# Patient Record
Sex: Male | Born: 1970 | Race: White | Hispanic: No | Smoking: Never smoker
Health system: Southern US, Community
[De-identification: ages and names within clinical notes are randomized; demographics above are authoritative.]

## PROBLEM LIST (undated history)

## (undated) HISTORY — PX: ANKLE SURGERY: SHX546

---

## 2000-02-06 ENCOUNTER — Emergency Department (HOSPITAL_COMMUNITY): Admission: EM | Admit: 2000-02-06 | Discharge: 2000-02-06 | Payer: Self-pay | Admitting: Emergency Medicine

## 2000-02-06 ENCOUNTER — Encounter: Payer: Self-pay | Admitting: Emergency Medicine

## 2000-02-12 ENCOUNTER — Inpatient Hospital Stay (HOSPITAL_COMMUNITY): Admission: RE | Admit: 2000-02-12 | Discharge: 2000-02-14 | Payer: Self-pay | Admitting: Orthopedic Surgery

## 2000-02-12 ENCOUNTER — Encounter: Payer: Self-pay | Admitting: Orthopedic Surgery

## 2002-12-19 ENCOUNTER — Emergency Department (HOSPITAL_COMMUNITY): Admission: EM | Admit: 2002-12-19 | Discharge: 2002-12-19 | Payer: Self-pay | Admitting: Emergency Medicine

## 2007-07-11 ENCOUNTER — Emergency Department (HOSPITAL_COMMUNITY): Admission: EM | Admit: 2007-07-11 | Discharge: 2007-07-11 | Payer: Self-pay | Admitting: Emergency Medicine
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
## 2011-01-30 ENCOUNTER — Inpatient Hospital Stay (HOSPITAL_COMMUNITY)
Admission: AD | Admit: 2011-01-30 | Discharge: 2011-02-02 | DRG: 881 | Disposition: A | Discharge status: Home / Self Care | Payer: Medicaid Other | Source: Ambulatory Visit | Attending: Psychiatry | Admitting: Psychiatry

## 2011-01-30 ENCOUNTER — Emergency Department (HOSPITAL_COMMUNITY)
Admission: EM | Admit: 2011-01-30 | Discharge: 2011-01-30 | Disposition: A | Discharge status: Other Acute Inpatient Hospital | Payer: Medicaid Other | Source: Home / Self Care | Attending: Emergency Medicine | Admitting: Emergency Medicine

## 2011-01-30 DIAGNOSIS — F3289 Other specified depressive episodes: Principal | ICD-10-CM

## 2011-01-30 DIAGNOSIS — R197 Diarrhea, unspecified: Secondary | ICD-10-CM | POA: Insufficient documentation

## 2011-01-30 DIAGNOSIS — F329 Major depressive disorder, single episode, unspecified: Principal | ICD-10-CM

## 2011-01-30 DIAGNOSIS — Z560 Unemployment, unspecified: Secondary | ICD-10-CM

## 2011-01-30 DIAGNOSIS — R45851 Suicidal ideations: Secondary | ICD-10-CM

## 2011-01-30 DIAGNOSIS — F1910 Other psychoactive substance abuse, uncomplicated: Secondary | ICD-10-CM

## 2011-01-30 DIAGNOSIS — F411 Generalized anxiety disorder: Secondary | ICD-10-CM | POA: Insufficient documentation

## 2011-01-30 DIAGNOSIS — F1994 Other psychoactive substance use, unspecified with psychoactive substance-induced mood disorder: Secondary | ICD-10-CM

## 2011-01-30 DIAGNOSIS — R1110 Vomiting, unspecified: Secondary | ICD-10-CM | POA: Insufficient documentation

## 2011-01-30 DIAGNOSIS — R112 Nausea with vomiting, unspecified: Secondary | ICD-10-CM | POA: Insufficient documentation

## 2011-01-30 DIAGNOSIS — R109 Unspecified abdominal pain: Secondary | ICD-10-CM | POA: Insufficient documentation

## 2011-01-30 LAB — COMPREHENSIVE METABOLIC PANEL
AST: 19 U/L (ref 0–37)
Albumin: 4.6 g/dL (ref 3.5–5.2)
Alkaline Phosphatase: 81 U/L (ref 39–117)
BUN: 7 mg/dL (ref 6–23)
CO2: 26 mEq/L (ref 19–32)
Chloride: 106 mEq/L (ref 96–112)
GFR calc Af Amer: >60 mL/min (ref >60)
GFR calc non Af Amer: >60 mL/min (ref >60)
Potassium: 4.3 mEq/L (ref 3.5–5.1)
Total Bilirubin: 0.5 mg/dL (ref 0.3–1.2)

## 2011-01-30 LAB — ETHANOL: Alcohol, Ethyl (B): 98 mg/dL — ABNORMAL HIGH (ref 0–10)

## 2011-01-30 LAB — DIFFERENTIAL
Basophils Relative: 1 % (ref 0–1)
Lymphocytes Relative: 24 % (ref 12–46)
Lymphs Abs: 1.8 10*3/uL (ref 0.7–4.0)
Monocytes Absolute: 0.6 10*3/uL (ref 0.1–1.0)
Monocytes Relative: 8 % (ref 3–12)
Neutro Abs: 5 10*3/uL (ref 1.7–7.7)
Neutrophils Relative %: 66 % (ref 43–77)

## 2011-01-30 LAB — CBC
HCT: 44.9 % (ref 39.0–52.0)
Hemoglobin: 15.4 g/dL (ref 13.0–17.0)
MCH: 32.8 pg (ref 26.0–34.0)
RBC: 4.69 MIL/uL (ref 4.22–5.81)

## 2011-01-30 LAB — RAPID URINE DRUG SCREEN, HOSP PERFORMED: Tetrahydrocannabinol: POSITIVE — AB

## 2011-01-31 DIAGNOSIS — F1910 Other psychoactive substance abuse, uncomplicated: Secondary | ICD-10-CM

## 2011-02-12 NOTE — Discharge Summary (Signed)
Howard Velazquez, Howard Velazquez     ACCOUNT NO.:  [id number]  MEDICAL RECORD NO.:  [id number]           PATIENT TYPE:  I  LOCATION:  0303                          FACILITY:  [facility]  PHYSICIAN:  Eulogio Ditch, MD DATE OF BIRTH:  02-17-1971  DATE OF ADMISSION:  01/30/2011 DATE OF DISCHARGE:  02/02/2011                              DISCHARGE SUMMARY   IDENTIFYING INFORMATION:  This is a 40 year old male.  This was a voluntary admission.  HISTORY OF PRESENT ILLNESS:  First [facility] admission for Howard Velazquez who presents with a history of depression and suicidal thoughts with a plan to shoot himself and reporting that he had access to a gun at home.  Had been using alcohol, drinking up to 12 beers daily, along with use of opiates and crack cocaine.  Prior to admission he had recently gone through 35,000 dollars worth of cash and assets to support his drug habit.  Parents very angry with him.  Also reporting some auditory hallucinations, noncommand in type, over the previous few days.  No history of previous admissions at the [facility].  Currently unemployed with a 57 year old child at home to support and single.  MEDICAL EVALUATION:  He was medically evaluated in the [facility] Emergency Room where he presented with Vital Signs: Temperature 98.2, pulse 89, respirations 18, blood pressure 123/87 and denying any chronic medical conditions.  A normally developed male in no acute distress and reported that he was coming off opiates and previous use was 2 days ago, using OxyContin.  Had also regularly been using marijuana, crack, alcohol, Klonopin, and other opiates.  Alcohol level 98.  Chemistry normal.  BUN 7, creatinine 1.21.  Normal liver enzymes.  Urine drug screen positive for cocaine, benzodiazepines, and cannabis metabolites.  Normal CBC, hemoglobin 15.4.  He received 0.1 mg of clonidine in the emergency room for withdrawal symptoms and metoclopramide 10 mg for nausea.  ADMITTING  MENTAL STATUS EXAMINATION:  A fully alert male, cooperative, casually dressed, fair eye contact, attentive, very tearful, feeling depressed and uncertain after going through significant amounts of cash. Thinking coherent.  Memory intact.  Cooperative.  Cognitive function intact.  Endorsing auditory hallucinations but did not appear to be responding or internally distracted.  AXIS I:  Mood disorder, not otherwise specified, rule out substance- induced mood disorder, polysubstance abuse, rule out dependence. AXIS II:  Deferred. AXIS III:  No diagnosis. AXIS IV:  Unemployed, financial issues. AXIS V:  Current 30.  COURSE OF HOSPITALIZATION:  He was admitted to our dual diagnosis unit and placed on clonidine and Librium withdrawal protocols.  He had been previously prescribed Lexapro 20 mg for depression by his primary provider and we elected to continue that.  He was gradually assimilated into the milieu, and participation in group activities was appropriate. Group therapy participation was adequate.  He reported having a lot of difficulty finding work due to [employer] and had broken up with his girlfriend 2 weeks prior to admission.  Previously divorced for 10 years.  He has a 50 year old son who he was helping to support.  Having a contentious relationship with parents who are angry about his substance abuse.  He gave permission  for a counselor to speak with his family, and he planned to live with either his girlfriend Howard Velazquez or another family member other than his parents at the time of discharge. He planned on returning to [facility] in [place] for his usual Wednesday appointment.  His girlfriend reported having no concerns with the patient's safety, that there were no weapons, drugs, or alcohol in the home.  Howard Velazquez's detox was uneventful.  Did have some skeletal muscle cramping alleviated with our routine detox medications.  By February 4th, he was requesting  discharge, denying any dangerous thoughts, and his girlfriend was willing to have him come home and felt that this was a safe plan.  DISCHARGE MENTAL STATUS EXAMINATION:  A Fully alert male.  No further withdrawal symptoms.  In full contact with reality with no dangerous ideas.  DISCHARGE DIAGNOSES: 1. Depressive disorder, not otherwise specified, rule out substance-     induced mood disorder. 2. Polysubstance abuse.  DISCHARGE MEDICATIONS:  Lexapro 20 mg daily.  DISCHARGE CONDITION:  Stable.  DISCHARGE PLAN:  Follow up at [facility] on Wednesday, February 8th, at 8:00 p.m.     Howard Velazquez, N.P.   ______________________________ Eulogio Ditch, MD    MAS/MEDQ  D:  02/04/2011  T:  02/04/2011  Job:  [id number]  Electronically Signed by Howard Velazquez N.P. on 02/05/2011 10:11:28 AM Electronically Signed by Eulogio Ditch  on 02/06/2011 09:18:24 AM

## 2011-02-17 NOTE — H&P (Signed)
NAME:  Howard Velazquez, Howard Velazquez     ACCOUNT NO.:  [id number]  MEDICAL RECORD NO.:  [id number]           PATIENT TYPE:  I  LOCATION:  0303                          FACILITY:  [facility]  PHYSICIAN:  Eulogio Ditch, MD DATE OF BIRTH:  11-Dec-1971  DATE OF ADMISSION:  01/30/2011 DATE OF DISCHARGE:                      PSYCHIATRIC ADMISSION ASSESSMENT   This is a 40 year old male voluntarily admitted on 01/30/2011.  HISTORY OF PRESENT ILLNESS:  The patient presents with a history of depression and suicidal ideation with a plan to shoot himself, reporting that he has a gun at home.  Has been using alcohol, drinking up to 12 beers a day, opiates and crack.  He states he also went through $35,000 worth of cash and assets.  States his parents are very angry with him. He denies any legal charges.  Reporting some auditory hallucinations, noncommand type, over the past few days.  PAST PSYCHIATRIC HISTORY:  First admission to [facility]. No current outpatient mental health therapy.  SOCIAL HISTORY:  The patient lives in [place].  He is single.  He has a 68 year old child.  He is currently unemployed.  FAMILY HISTORY:  None.  ALCOHOL AND DRUG HISTORY:  The patient has been drinking up to 12 beers daily.  Denies any seizures or blackouts.  No legal charges.  Again, has been using opiates and crack cocaine.  Denies any IV drug use.  PRIMARY CARE PROVIDER:  None.  MEDICAL PROBLEMS:  The patient considers himself healthy.  No acute or chronic health issues.  MEDICATIONS:  Has been on Lexapro 20 mg prescribed by his primary care provider.  He does not see any benefit from the medicine yet.  Denies any other medications.  DRUG ALLERGIES:  No known allergies.  PHYSICAL EXAMINATION:  Physical exam was done at the [facility] emergency department.  This is a normally-developed male, appears in no distress.  He is complaining of some withdrawal symptoms.  His CBC was within  normal limits.  His sodium 146.  His urine drug screen was positive for cocaine, positive for cannabis.  His alcohol level was 98. He did receive Reglan, clonidine and Ativan during the emergency room stay.  MENTAL STATUS EXAMINATION:  The patient is alert and cooperative, casually dressed.  Fair eye contact.  He is very tearful.  The patient is feeling very depressed and uncertain about the development of what he has done.  Thought processes are coherent.  Did endorse auditory hallucinations, does not appear to be actively responding.  Cognitive function intact.  His memory appears intact.  He is agreeable to recommendations and followup.  AXIS I:  Polysubstance abuse, rule out dependence.  Substance use.  Mood disorder. AXIS II:  Deferred. AXIS III:  No known medical conditions. AXIS IV:  Problems with occupation and primary support.  Other psychosocial problems.  Financial issues. AXIS V:  Current is 30.  Our plan is to put the patient on a clonidine and a Librium protocol. We will continue with his Lexapro.  We will have Risperdal available b.i.d. for psychotic symptoms.  Encourage the patient to attend groups. We will continue to assess motivation for rehab, consider a family session with his support group.  Tentative length of stay is 3 to 5 days.     Landry Corporal, N.P.   ______________________________ Eulogio Ditch, MD    JO/MEDQ  D:  01/31/2011  T:  01/31/2011  Job:  [id number]  Electronically Signed by Limmie PatriciaP. on 02/11/2011 02:58:12 PM Electronically Signed by Eulogio Ditch  on 02/17/2011 06:06:23 AM
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
## 2011-05-17 NOTE — Op Note (Signed)
[place]  Patient:    Howard Velazquez, Howard Velazquez              MRN: [id number] Proc. Date: 02/12/00 Adm. Date:  [id number] Attending:  Aldean Baker V                           Operative Report  PREOPERATIVE DIAGNOSIS:  Right calcaneus fracture, Allyne Gee 3 part.  POSTOPERATIVE DIAGNOSIS:  Right calcaneus fracture, Sanders 3 part.  PROCEDURE: Open reduction and internal fixation with an ACE titanium plate and titanium screws.  SURGEON: Nadara Mustard, M.D.  ANESTHESIA: General endotracheal.  ESTIMATED BLOOD LOSS: Minimal.  ANTIBIOTICS: Kefzol, 1 gm.  DRAINS: None.  COMPLICATIONS: None.  TOURNIQUET TIME: 119 minutes at 300 mmHg.  DISPOSITION:  To the PACU in stable condition with a ______  dressing.  INDICATIONS:  The patient is a 40 year old gentleman, status post Sanders three-part calcaneus fracture.  The patient was delayed until the swelling had resolved and presents at this time for surgical intervention.  The risks and benefits were discussed.  The patient and his mother state that they understand  including infection, neurovascular injury, persistent pain, numbness, need for additional surgery, need for fusion, tendon injury as well as the wound not healing.  The patient and his mother state that they understand and wish to proceed at this time.  DESCRIPTION OF PROCEDURE: The patient was brought to OR room 6 and underwent a general anesthetic.  After adequate levels of anesthesia had been obtained the patient was placed in the left lateral decubitus position with the right side up. His leg was padded to provide an operating platform.  The leg was then placed with a thigh tourniquet, prepped using DuraPrep and draped into a sterile field.  A lateral incision was made along the vermilion border and curved over the posterior aspect.  This was carried sharply down to the calcaneus.  Periosteal dissection was performed to elevate  the flap.  The nerves and tendons were maintained in the flap.  A 2 mm K-wire was inserted into the fibula and to the neck of the talus to hold the flap up.  The flap was kept moist during the case with normal saline.  The fragments were identified.  A Shantz pin was place in the calcaneus to manipulate the calcaneus.  The middle and subsequent tali pieces were stabilized and reduced. The lateral wall piece was then stabilized to these pieces with temporary K-wire fixation.  The calcaneus and the calcaneocuboid joint pieces were stabilized and the titanium plate was contoured and the plate was used to secure the fragments. The subtalar joint was aligned first followed by the calcaneocuboid.  The calcaneus tuberosity was pulled out to length.  The titanium screws were used for stabilization.  The C-arm was used to check alignment both AP and lateral and Harris heel views which showed adequate alignment.  The wound was irrigated with normal saline.  The deep fascia was closed using interrupted 2-0 Vicryl.  The skin was closed using a far-near, near-far stitch at the edges and ______ stitch at he medial aspect of the wound.  The skin was closed without tension.  The wound was covered with Adaptic, orthopedic sponges and a ______ compressive dressing was applied.  The tourniquet was first inflated prior to the start of the case with  elevation to 300 mmHg.  Total tourniquet time was 119 minutes.  The tourniquet as deflated after  the dressing was completely applied.  The patient was extubated nd taken to the PACU in stable condition. DD:  02/12/00 TD:  02/12/00 Job: [id number]

## 2011-10-15 LAB — POCT I-STAT CREATININE
Creatinine, Ser: 1
Operator id: 196461

## 2011-10-15 LAB — I-STAT 8, (EC8 V) (CONVERTED LAB)
Bicarbonate: 23.2
HCT: 43
TCO2: 24
pCO2, Ven: 33.6 — ABNORMAL LOW
pH, Ven: 7.448 — ABNORMAL HIGH

## 2011-10-15 LAB — SAMPLE TO BLOOD BANK

## 2018-02-11 ENCOUNTER — Encounter (HOSPITAL_COMMUNITY): Payer: Self-pay

## 2018-02-11 ENCOUNTER — Emergency Department (HOSPITAL_COMMUNITY)
Admission: EM | Admit: 2018-02-11 | Discharge: 2018-02-11 | Disposition: A | Discharge status: Home / Self Care | Payer: Self-pay | Attending: Emergency Medicine | Admitting: Emergency Medicine

## 2018-02-11 DIAGNOSIS — F1110 Opioid abuse, uncomplicated: Secondary | ICD-10-CM

## 2018-02-11 DIAGNOSIS — F1920 Other psychoactive substance dependence, uncomplicated: Secondary | ICD-10-CM

## 2018-02-11 DIAGNOSIS — F1120 Opioid dependence, uncomplicated: Secondary | ICD-10-CM | POA: Insufficient documentation

## 2018-02-11 MED ORDER — ONDANSETRON 8 MG PO TBDP: 8.0000 mg | ORAL_TABLET | Freq: Three times a day (TID) | ORAL | 0 refills | Status: AC | PRN

## 2018-02-11 NOTE — Discharge Instructions (Signed)
It was our pleasure to provide your ER care today - we hope that you feel better.  Take acetaminophen and/or ibuprofen as need for pain.   If nauseated, you may try taking zofran as prescribed, as need.   Use resource guide provided for substance abuse treatment programs.   Follow up with primary care doctor in the next couple weeks.

## 2018-02-11 NOTE — ED Provider Notes (Signed)
[facility] Provider Note   CSN: [id number] Arrival date & time: 02/11/18  1301     History   Chief Complaint Chief Complaint  Patient presents with  . Drug Problem    HPI Howard DyerChristopher Velazquez is a 47 y.o. male.  Patient with hx suboxone and heroin abuse presents indicating he wants rehab.  He indicates [facility].  Patient states last used a day ago. No vomiting or diarrhea. No abd pain. No sob. Patient indicates using intermittently x months. Denies etoh abuse. Denies depression or thoughts of self harm. Denies recent acute physical illness. Is eating and drinking. No wt loss. No fevers.    The history is provided by the patient.  Drug Problem  Pertinent negatives include no chest pain, no headaches and no shortness of breath.    History reviewed. No pertinent past medical history.  There are no active problems to display for this patient.   Past Surgical History:  Procedure Laterality Date  . ANKLE SURGERY         Home Medications    Prior to Admission medications   Not on File    Family History No family history on file.  Social History Social History   Tobacco Use  . Smoking status: Never Smoker  . Smokeless tobacco: Never Used  Substance Use Topics  . Alcohol use: No    Frequency: Never  . Drug use: Yes    Comment: Opioids, Heroin      Allergies   Patient has no known allergies.   Review of Systems Review of Systems  Constitutional: Negative for fever.  Respiratory: Negative for shortness of breath.   Cardiovascular: Negative for chest pain.  Gastrointestinal: Negative for diarrhea and vomiting.  Neurological: Negative for headaches.  Psychiatric/Behavioral: Negative for dysphoric mood.     Physical Exam Updated Vital Signs BP 137/76 (BP Location: Right Arm)   Pulse 76   Temp 98.8 F (37.1 C) (Oral)   Resp 20   Ht 1.829 m (6')   Wt 104.3 kg (230 lb)   SpO2  99%   BMI 31.19 kg/m   Physical Exam  Constitutional: He appears well-developed and well-nourished. No distress.  HENT:  Head: Atraumatic.  Mouth/Throat: Oropharynx is clear and moist.  Eyes: Conjunctivae are normal. Pupils are equal, round, and reactive to light. No scleral icterus.  Neck: Neck supple. No tracheal deviation present.  Cardiovascular: Normal rate, regular rhythm, normal heart sounds and intact distal pulses.  Pulmonary/Chest: Effort normal and breath sounds normal. No accessory muscle usage. No respiratory distress.  Abdominal: He exhibits no distension. There is no tenderness.  Musculoskeletal: He exhibits no edema.  Neurological: He is alert.  Speech normal. Ambulates w steady gait.   Skin: Skin is warm and dry. He is not diaphoretic.  Psychiatric: He has a normal mood and affect.  Nursing note and vitals reviewed.    ED Treatments / Results  Labs (all labs ordered are listed, but only abnormal results are displayed) Labs Reviewed - No data to display  EKG  EKG Interpretation None       Radiology No results found.  Procedures Procedures (including critical care time)  Medications Ordered in ED Medications - No data to display   Initial Impression / Assessment and Plan / ED Course  I have reviewed the triage vital signs and the nursing notes.  Pertinent labs & imaging results that were available during my care of  the patient were reviewed by me and considered in my medical decision making (see chart for details).  Patient provided list community resources for rehab/drug abuse programs as outpatient.  Reviewed nursing notes and prior charts for additional history.   Vitals normal. Normal mood/affect.  Pt appears stable for d/c.     Final Clinical Impressions(s) / ED Diagnoses   Final diagnoses:  None    ED Discharge Orders    None       Cathren Laine, MD 02/11/18 1414

## 2018-02-11 NOTE — ED Notes (Signed)
Pt talking with patient about resources that he can use outpatient.

## 2018-02-11 NOTE — ED Triage Notes (Signed)
Per Pt, Pt is coming from home with complaints of being addicted to Cymboxin or Heroin. Pt denies IV use. Reports he has been on it for two years. Talked with [facility] and was sent over for detox.

## 2019-01-14 DIAGNOSIS — Z0000 Encounter for general adult medical examination without abnormal findings: Secondary | ICD-10-CM | POA: Diagnosis not present

## 2019-01-14 DIAGNOSIS — G5600 Carpal tunnel syndrome, unspecified upper limb: Secondary | ICD-10-CM | POA: Diagnosis not present

## 2019-01-29 DIAGNOSIS — E669 Obesity, unspecified: Secondary | ICD-10-CM | POA: Diagnosis not present

## 2019-01-29 DIAGNOSIS — G5600 Carpal tunnel syndrome, unspecified upper limb: Secondary | ICD-10-CM | POA: Diagnosis not present

## 2019-01-29 DIAGNOSIS — R7989 Other specified abnormal findings of blood chemistry: Secondary | ICD-10-CM | POA: Diagnosis not present

## 2019-02-04 DIAGNOSIS — M25541 Pain in joints of right hand: Secondary | ICD-10-CM | POA: Diagnosis not present

## 2019-02-04 DIAGNOSIS — G5603 Carpal tunnel syndrome, bilateral upper limbs: Secondary | ICD-10-CM | POA: Diagnosis not present

## 2019-02-04 DIAGNOSIS — M2550 Pain in unspecified joint: Secondary | ICD-10-CM | POA: Diagnosis not present

## 2019-02-09 DIAGNOSIS — G5603 Carpal tunnel syndrome, bilateral upper limbs: Secondary | ICD-10-CM | POA: Diagnosis not present

## 2024-01-02 ENCOUNTER — Other Ambulatory Visit (INDEPENDENT_AMBULATORY_CARE_PROVIDER_SITE_OTHER): Payer: Self-pay

## 2024-01-02 ENCOUNTER — Encounter: Payer: Self-pay | Admitting: Physician Assistant

## 2024-01-02 ENCOUNTER — Other Ambulatory Visit: Payer: Self-pay

## 2024-01-02 ENCOUNTER — Ambulatory Visit (INDEPENDENT_AMBULATORY_CARE_PROVIDER_SITE_OTHER): Payer: Medicaid Other | Admitting: Physician Assistant

## 2024-01-02 DIAGNOSIS — M25551 Pain in right hip: Secondary | ICD-10-CM | POA: Diagnosis not present

## 2024-01-02 DIAGNOSIS — G8929 Other chronic pain: Secondary | ICD-10-CM

## 2024-01-02 DIAGNOSIS — M25511 Pain in right shoulder: Secondary | ICD-10-CM | POA: Insufficient documentation

## 2024-01-02 MED ORDER — TIZANIDINE HCL 4 MG PO TABS: 4.0000 mg | ORAL_TABLET | Freq: Four times a day (QID) | ORAL | 0 refills | Status: AC | PRN

## 2024-01-02 MED ORDER — DICLOFENAC SODIUM 75 MG PO TBEC: 75.0000 mg | DELAYED_RELEASE_TABLET | Freq: Two times a day (BID) | ORAL | 1 refills | Status: AC | PRN

## 2024-01-02 NOTE — Progress Notes (Signed)
 Office Visit Note   Patient: Howard Velazquez           Date of Birth: May 22, 1971           MRN: [id number] Visit Date: 01/02/2024              Requested by: No referring provider defined for this encounter. PCP: Patient, No Pcp Per  Chief Complaint  Patient presents with   Right Hip - Pain   Right Shoulder - Pain      HPI: Patient is a pleasant 53 year old gentleman with a complaint of multiple joint pain.  Specifically he comes in today for right shoulder and right hip pain.  States this is chronic.  This is been chronic for about a year.  The right shoulder is the more significant problem.  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  He has gotten an intra-articular glenohumeral joint last year by another provider so that did not really help very much.  Also was given 15 mg meloxicam which she said was not very helpful.  Has not done physical therapy.  Denies any neck pain.  Does relate that multiple joints hurt and aching all the time.  Also complaining of right lateral hip pain.  Denies any injury.  Has 1 area on the lateral side of the hip says the pain radiates down the leg.  Denies any specific radicular symptoms.  Assessment & Plan: Visit Diagnoses:  1. Pain of right hip   2. Chronic right shoulder pain   3. Pain in right hip     Plan: Patient with multiple joint pain.  Diagnostic evaluation I recommended inflammatory labs.  He like to defer that at this time.  His right shoulder he has a lot of pain with movement rotator cuff tendinitis versus some adhesive capsulitis.  Discussed physical therapy.  He would like to defer this as well.  This is now been going on a year and he is getting significantly more pain that did not help with anti-inflammatories given his limited motion would like for him to at least get an MRI he said he be happy to do this.  Will go forward with a subacromial injection today.  As far as the hip does not really have any groin pain no radicular  findings.  He is focally tender over the greater trochanteric bursa we will try an injection.  Should follow-up with Dr. Addie once the MRI of the shoulder is complete.  Will also try switching him to Voltaren  orally.  See if this helps more than the meloxicam.  He is asking for 1 refill on Zanaflex  a medication is taken before I have given him 1 refill.  Follow-Up Instructions: No follow-ups on file.   Ortho Exam  Patient is alert, oriented, no adenopathy, well-dressed, normal affect, normal respiratory effort. Examination of his right shoulder no redness no erythema he is neurovascularly intact.  He is limited with forward elevation actively by pain.  Negative drop arm test.  Can internally rotate just to his belt line.  Abduction external and internal strength intact.  Has positive empty can test less positive though positive speeds test.  Has some pain and limitations as well with external rotation. Right hip he is neurovascular intact compartments of the lower leg are soft and nontender no pain in the groin he does get pain over the trochanteric bursa.  Has good strength resisted extension flexion of his ankles and hips and knees negative straight  leg raise  Imaging: No results found. No images are attached to the encounter.  Labs: No results found for: HGBA1C, ESRSEDRATE, CRP, LABURIC, REPTSTATUS, GRAMSTAIN, CULT, LABORGA   Lab Results  Component Value Date   ALBUMIN 4.6 01/30/2011    No results found for: MG No results found for: VD25OH  No results found for: PREALBUMIN    Latest Ref Rng & Units 01/30/2011    2:05 PM 07/11/2007    3:55 PM  CBC EXTENDED  WBC 4.0 - 10.5 K/uL 7.6    RBC 4.22 - 5.81 MIL/uL 4.69    Hemoglobin 13.0 - 17.0 g/dL 84.5  85.3   HCT 60.9 - 52.0 % 44.9  43.0   Platelets 150 - 400 K/uL 310    NEUT# 1.7 - 7.7 K/uL 5.0    Lymph# 0.7 - 4.0 K/uL 1.8       There is no height or weight on file to calculate BMI.  Orders:  Orders  Placed This Encounter  Procedures   XR HIP UNILAT W OR W/O PELVIS 1V RIGHT   XR Shoulder Right   No orders of the defined types were placed in this encounter.    Procedures: No procedures performed  Clinical Data: No additional findings.  ROS:  All other systems negative, except as noted in the HPI. Review of Systems  Objective: Vital Signs: There were no vitals taken for this visit.  Specialty Comments:  No specialty comments available.  PMFS History: Patient Active Problem List   Diagnosis Date Noted   Pain in right shoulder 01/02/2024   Pain in right hip 01/02/2024   History reviewed. No pertinent past medical history.  History reviewed. No pertinent family history.  Past Surgical History:  Procedure Laterality Date   ANKLE SURGERY     Social History   Occupational History   Not on file  Tobacco Use   Smoking status: Never   Smokeless tobacco: Never  Substance and Sexual Activity   Alcohol  use: No   Drug use: Yes    Comment: Opioids, Heroin    Sexual activity: Not on file

## 2024-03-25 ENCOUNTER — Encounter: Payer: Self-pay | Admitting: Neurology

## 2024-05-25 ENCOUNTER — Ambulatory Visit: Admitting: Neurology

## 2024-07-30 ENCOUNTER — Ambulatory Visit: Admitting: Neurology
